# Patient Record
Sex: Female | Born: 1995 | Race: White | Hispanic: No | Marital: Single | Smoking: Never smoker
Health system: Southern US, Community
[De-identification: ages and names within clinical notes are randomized; demographics above are authoritative.]

## PROBLEM LIST (undated history)

## (undated) DIAGNOSIS — R011 Cardiac murmur, unspecified: Secondary | ICD-10-CM

## (undated) DIAGNOSIS — E079 Disorder of thyroid, unspecified: Secondary | ICD-10-CM

## (undated) DIAGNOSIS — H532 Diplopia: Secondary | ICD-10-CM

## (undated) HISTORY — DX: Diplopia: H53.2

## (undated) HISTORY — DX: Disorder of thyroid, unspecified: E07.9

## (undated) HISTORY — DX: Cardiac murmur, unspecified: R01.1

---
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
## 2016-09-15 DIAGNOSIS — Q213 Tetralogy of Fallot: Secondary | ICD-10-CM | POA: Insufficient documentation

## 2016-09-15 DIAGNOSIS — E663 Overweight: Secondary | ICD-10-CM | POA: Diagnosis not present

## 2017-04-16 ENCOUNTER — Ambulatory Visit (INDEPENDENT_AMBULATORY_CARE_PROVIDER_SITE_OTHER): Payer: BLUE CROSS/BLUE SHIELD | Admitting: Internal Medicine

## 2017-04-16 ENCOUNTER — Encounter: Payer: Self-pay | Admitting: Internal Medicine

## 2017-04-16 VITALS — BP 122/80 | HR 70 | Temp 98.1°F | Resp 12 | Ht 59.45 in | Wt 157.2 lb

## 2017-04-16 DIAGNOSIS — Z0000 Encounter for general adult medical examination without abnormal findings: Secondary | ICD-10-CM

## 2017-04-16 DIAGNOSIS — Q213 Tetralogy of Fallot: Secondary | ICD-10-CM

## 2017-04-16 DIAGNOSIS — Z13220 Encounter for screening for lipoid disorders: Secondary | ICD-10-CM

## 2017-04-16 NOTE — Progress Notes (Signed)
Patient ID: Kristine Walton, female   DOB: 1996-02-04, 21 y.o.   MRN: [id number]   Subjective:    Patient ID: Kristine Walton, female    DOB: October 23, 1996, 21 y.o.   MRN: [id number]  HPI  Patient here to establish care.  She has a history of Tetralogy of Fallot.  Followed by cardiology at [facility].  She reports she is doing well.  No chest pain.  No sob.  No acid reflux.  No abdominal pain.  Bowels moving.  Discussed possible sleep apnea.  States she feels rested when awakens.  LMP two days ago.  Overall she feels she is doing well.      Past Medical History:  Diagnosis Date  . Murmur    Past Surgical History:  Procedure Laterality Date  . TETRALOGY OF FALLOT REPAIR  1998   History reviewed. No pertinent family history. Social History   Social History  . Marital status: Single    Spouse name: N/A  . Number of children: N/A  . Years of education: N/A   Social History Main Topics  . Smoking status: Never Smoker  . Smokeless tobacco: Never Used  . Alcohol use No  . Drug use: No  . Sexual activity: Not Asked   Other Topics Concern  . None   Social History Narrative  . None    Outpatient Encounter Prescriptions as of 04/16/2017  Medication Sig  . cetirizine (ZYRTEC) 10 MG tablet Take 1 tablet by mouth daily.   No facility-administered encounter medications on file as of 04/16/2017.     Review of Systems  Constitutional: Negative for appetite change and unexpected weight change.  HENT: Negative for congestion and sinus pressure.   Respiratory: Negative for cough, chest tightness and shortness of breath.   Cardiovascular: Negative for chest pain, palpitations and leg swelling.  Gastrointestinal: Negative for abdominal pain, diarrhea, nausea and vomiting.  Genitourinary: Negative for difficulty urinating and dysuria.  Musculoskeletal: Negative for back pain and joint swelling.  Skin: Negative for color change and rash.  Neurological: Negative for dizziness, light-headedness and  headaches.  Psychiatric/Behavioral: Negative for agitation and dysphoric mood.       Objective:    Physical Exam  Constitutional: She appears well-developed and well-nourished. No distress.  HENT:  Nose: Nose normal.  Mouth/Throat: Oropharynx is clear and moist.  Neck: Neck supple. No thyromegaly present.  Cardiovascular: Normal rate and regular rhythm.   Pulmonary/Chest: Breath sounds normal. No respiratory distress. She has no wheezes.  Abdominal: Soft. Bowel sounds are normal. There is no tenderness.  Musculoskeletal: She exhibits no edema or tenderness.  Lymphadenopathy:    She has no cervical adenopathy.  Skin: No rash noted. No erythema.  Psychiatric: She has a normal mood and affect. Her behavior is normal.    BP 122/80 (BP Location: Right Arm, Cuff Size: Normal)   Pulse 70   Temp 98.1 F (36.7 C) (Oral)   Resp 12   Ht 4' 11.45" (1.51 m)   Wt 157 lb 3.2 oz (71.3 kg)   LMP 04/14/2017   SpO2 98%   BMI 31.27 kg/m  Wt Readings from Last 3 Encounters:  04/16/17 157 lb 3.2 oz (71.3 kg) (85 %, Z= 1.05)*   * Growth percentiles are based on CDC 2-20 Years data.        Assessment & Plan:   Problem List Items Addressed This Visit    Health care maintenance    Will get her set up for a physical.  Tetralogy of Fallot - Primary    Followed by cardiology.  Discussed sleep apnea.  Wants to hold on testing at this time.  Follow.        Relevant Orders   CBC with Differential   Comprehensive metabolic panel   TSH    Other Visit Diagnoses    Screening cholesterol level       Relevant Orders   Lipid panel       Dale DurhamSCOTT, Yidel Teuscher, MD

## 2017-04-16 NOTE — Progress Notes (Signed)
Pre-visit discussion using our clinic review tool. No additional management support is needed unless otherwise documented below in the visit note.  

## 2017-04-25 ENCOUNTER — Encounter: Payer: Self-pay | Admitting: Internal Medicine

## 2017-04-25 DIAGNOSIS — Z0000 Encounter for general adult medical examination without abnormal findings: Secondary | ICD-10-CM | POA: Insufficient documentation

## 2017-04-25 NOTE — Assessment & Plan Note (Signed)
Followed by cardiology.  Discussed sleep apnea.  Wants to hold on testing at this time.  Follow.

## 2017-04-25 NOTE — Assessment & Plan Note (Signed)
Will get her set up for a physical.

## 2017-04-26 ENCOUNTER — Other Ambulatory Visit (INDEPENDENT_AMBULATORY_CARE_PROVIDER_SITE_OTHER): Payer: BLUE CROSS/BLUE SHIELD

## 2017-04-26 DIAGNOSIS — Z13220 Encounter for screening for lipoid disorders: Secondary | ICD-10-CM

## 2017-04-26 DIAGNOSIS — Q213 Tetralogy of Fallot: Secondary | ICD-10-CM | POA: Diagnosis not present

## 2017-04-26 LAB — LIPID PANEL
CHOLESTEROL: 139 mg/dL (ref 0–200)
HDL: 45.2 mg/dL (ref >39.00)
LDL CALC: 57 mg/dL (ref 0–99)
NONHDL: 93.99
Total CHOL/HDL Ratio: 3
Triglycerides: 184 mg/dL — ABNORMAL HIGH (ref 0.0–149.0)
VLDL: 36.8 mg/dL (ref 0.0–40.0)

## 2017-04-26 LAB — COMPREHENSIVE METABOLIC PANEL
ALT: 21 U/L (ref 0–35)
AST: 17 U/L (ref 0–37)
Albumin: 4.5 g/dL (ref 3.5–5.2)
Alkaline Phosphatase: 77 U/L (ref 47–119)
BUN: 14 mg/dL (ref 6–23)
CHLORIDE: 104 meq/L (ref 96–112)
CO2: 24 mEq/L (ref 19–32)
Calcium: 9.6 mg/dL (ref 8.4–10.5)
Creatinine, Ser: 0.69 mg/dL (ref 0.40–1.20)
GFR: 115.4 mL/min (ref >60.00)
GLUCOSE: 91 mg/dL (ref 70–99)
Potassium: 4.3 mEq/L (ref 3.5–5.1)
SODIUM: 135 meq/L (ref 135–145)
Total Bilirubin: 0.2 mg/dL (ref 0.2–1.2)
Total Protein: 7.9 g/dL (ref 6.0–8.3)

## 2017-04-26 LAB — CBC WITH DIFFERENTIAL/PLATELET
BASOS PCT: 0.2 % (ref 0.0–3.0)
Basophils Absolute: 0 10*3/uL (ref 0.0–0.1)
EOS PCT: 1.5 % (ref 0.0–5.0)
Eosinophils Absolute: 0.1 10*3/uL (ref 0.0–0.7)
HEMATOCRIT: 39.5 % (ref 36.0–49.0)
HEMOGLOBIN: 13 g/dL (ref 12.0–16.0)
Lymphocytes Relative: 27.4 % (ref 24.0–48.0)
Lymphs Abs: 2.5 10*3/uL (ref 0.7–4.0)
MCHC: 33 g/dL (ref 31.0–37.0)
MCV: 89 fl (ref 78.0–98.0)
MONOS PCT: 6.8 % (ref 3.0–12.0)
Monocytes Absolute: 0.6 10*3/uL (ref 0.1–1.0)
Neutro Abs: 5.8 10*3/uL (ref 1.4–7.7)
Neutrophils Relative %: 64.1 % (ref 43.0–71.0)
Platelets: 245 10*3/uL (ref 150.0–575.0)
RBC: 4.44 Mil/uL (ref 3.80–5.70)
RDW: 13.8 % (ref 11.4–15.5)
WBC: 9.1 10*3/uL (ref 4.5–13.5)

## 2017-04-26 LAB — TSH: TSH: 5.72 u[IU]/mL — ABNORMAL HIGH (ref 0.40–5.00)

## 2017-04-27 ENCOUNTER — Other Ambulatory Visit: Payer: Self-pay | Admitting: Internal Medicine

## 2017-04-27 DIAGNOSIS — E039 Hypothyroidism, unspecified: Secondary | ICD-10-CM

## 2017-04-27 NOTE — Progress Notes (Signed)
Order placed for f/u tsh.  

## 2017-06-01 ENCOUNTER — Other Ambulatory Visit (INDEPENDENT_AMBULATORY_CARE_PROVIDER_SITE_OTHER): Payer: BLUE CROSS/BLUE SHIELD

## 2017-06-01 DIAGNOSIS — E039 Hypothyroidism, unspecified: Secondary | ICD-10-CM | POA: Diagnosis not present

## 2017-06-01 LAB — TSH: TSH: 6.59 u[IU]/mL — ABNORMAL HIGH (ref 0.40–5.00)

## 2017-06-07 ENCOUNTER — Other Ambulatory Visit: Payer: Self-pay | Admitting: Internal Medicine

## 2017-06-07 DIAGNOSIS — R7989 Other specified abnormal findings of blood chemistry: Secondary | ICD-10-CM

## 2017-06-07 NOTE — Progress Notes (Signed)
Order placed for f/u tsh.  

## 2017-07-27 ENCOUNTER — Other Ambulatory Visit: Payer: BLUE CROSS/BLUE SHIELD

## 2017-08-06 ENCOUNTER — Other Ambulatory Visit (INDEPENDENT_AMBULATORY_CARE_PROVIDER_SITE_OTHER): Payer: BLUE CROSS/BLUE SHIELD

## 2017-08-06 DIAGNOSIS — R946 Abnormal results of thyroid function studies: Secondary | ICD-10-CM

## 2017-08-06 DIAGNOSIS — R7989 Other specified abnormal findings of blood chemistry: Secondary | ICD-10-CM

## 2017-08-06 LAB — TSH: TSH: 7.77 u[IU]/mL — AB (ref 0.35–5.50)

## 2017-08-09 ENCOUNTER — Other Ambulatory Visit: Payer: Self-pay

## 2017-08-09 ENCOUNTER — Other Ambulatory Visit: Payer: Self-pay | Admitting: Internal Medicine

## 2017-08-09 DIAGNOSIS — E039 Hypothyroidism, unspecified: Secondary | ICD-10-CM

## 2017-08-09 MED ORDER — LEVOTHYROXINE SODIUM 50 MCG PO TABS: 50.0000 ug | ORAL_TABLET | Freq: Every day | ORAL | 3 refills | Status: DC

## 2017-08-09 NOTE — Progress Notes (Signed)
Order placed for f/u tsh.  

## 2017-09-14 DIAGNOSIS — I517 Cardiomegaly: Secondary | ICD-10-CM | POA: Diagnosis not present

## 2017-09-14 DIAGNOSIS — G4733 Obstructive sleep apnea (adult) (pediatric): Secondary | ICD-10-CM | POA: Diagnosis not present

## 2017-09-14 DIAGNOSIS — E663 Overweight: Secondary | ICD-10-CM | POA: Diagnosis not present

## 2017-09-14 DIAGNOSIS — I071 Rheumatic tricuspid insufficiency: Secondary | ICD-10-CM | POA: Diagnosis not present

## 2017-09-14 DIAGNOSIS — Q213 Tetralogy of Fallot: Secondary | ICD-10-CM | POA: Diagnosis not present

## 2017-09-14 DIAGNOSIS — J984 Other disorders of lung: Secondary | ICD-10-CM | POA: Diagnosis not present

## 2017-09-14 DIAGNOSIS — Z23 Encounter for immunization: Secondary | ICD-10-CM | POA: Diagnosis not present

## 2017-09-21 ENCOUNTER — Other Ambulatory Visit: Payer: BLUE CROSS/BLUE SHIELD

## 2017-09-21 ENCOUNTER — Other Ambulatory Visit
Admission: RE | Admit: 2017-09-21 | Discharge: 2017-09-21 | Disposition: A | Discharge status: Home / Self Care | Payer: BLUE CROSS/BLUE SHIELD | Source: Ambulatory Visit | Attending: Internal Medicine | Admitting: Internal Medicine

## 2017-09-21 DIAGNOSIS — E039 Hypothyroidism, unspecified: Secondary | ICD-10-CM | POA: Insufficient documentation

## 2017-09-21 LAB — TSH: TSH: 2.996 u[IU]/mL (ref 0.350–4.500)

## 2017-09-21 NOTE — Addendum Note (Signed)
Addended by: Penne Lash on: 09/21/2017 03:20 PM   Modules accepted: Orders

## 2017-09-21 NOTE — Addendum Note (Signed)
Addended by: Penne Lash on: 09/21/2017 10:15 AM   Modules accepted: Orders

## 2017-09-22 ENCOUNTER — Other Ambulatory Visit: Payer: Self-pay | Admitting: Internal Medicine

## 2017-09-22 DIAGNOSIS — E039 Hypothyroidism, unspecified: Secondary | ICD-10-CM

## 2017-09-22 NOTE — Progress Notes (Signed)
Order placed for f/u tsh.  

## 2017-10-11 ENCOUNTER — Telehealth: Payer: Self-pay | Admitting: Internal Medicine

## 2017-10-11 DIAGNOSIS — R899 Unspecified abnormal finding in specimens from other organs, systems and tissues: Secondary | ICD-10-CM

## 2017-10-11 NOTE — Telephone Encounter (Signed)
Can you put order in and I will call and let mom know.

## 2017-10-11 NOTE — Telephone Encounter (Signed)
TSH order is already in.  Does she need another lab?

## 2017-10-11 NOTE — Telephone Encounter (Signed)
Pt mom called about pt needing a lab appt for 3 month. Order is needed please and thank you! Pt mom would like for pt to go to the hospital to get labs done. Please advise?  Call mom @ [phone].

## 2017-10-13 NOTE — Telephone Encounter (Signed)
Order [facility] for [facility] and message sent via my chart to patient to let her know.

## 2017-10-20 DIAGNOSIS — Q213 Tetralogy of Fallot: Secondary | ICD-10-CM | POA: Diagnosis not present

## 2017-11-29 ENCOUNTER — Other Ambulatory Visit: Payer: Self-pay | Admitting: Internal Medicine

## 2017-12-31 ENCOUNTER — Other Ambulatory Visit
Admission: RE | Admit: 2017-12-31 | Discharge: 2017-12-31 | Disposition: A | Discharge status: Home / Self Care | Payer: BLUE CROSS/BLUE SHIELD | Source: Ambulatory Visit | Attending: Internal Medicine | Admitting: Internal Medicine

## 2017-12-31 DIAGNOSIS — R899 Unspecified abnormal finding in specimens from other organs, systems and tissues: Secondary | ICD-10-CM

## 2017-12-31 LAB — TSH: TSH: 2.507 u[IU]/mL (ref 0.350–4.500)

## 2018-01-01 ENCOUNTER — Encounter: Payer: Self-pay | Admitting: Internal Medicine

## 2018-02-23 ENCOUNTER — Encounter: Payer: Self-pay | Admitting: Internal Medicine

## 2018-02-24 NOTE — Telephone Encounter (Signed)
I have only seen her on one visit.  If she is having persistent issues, she needs to be evaluated and then can best determine long term treatment.  Is she pregnant?  If so, needs to contact her OB for evaluation and treatment.

## 2018-02-24 NOTE — Telephone Encounter (Signed)
Called and spoke with patients mom. She is not pregnant and this is persistent but she does not get them very often. She uses abreva when she does get them. Patients mom stated that she agreed with evaluation since you have only seen her once and also stated that she has her yearly check up on 04/19/18 and they would discuss treatment with you then. They do not feel that it is urgent. If something changes and they feel that she needs to come in at earlier date, they will call us back.

## 2018-03-22 DIAGNOSIS — H5213 Myopia, bilateral: Secondary | ICD-10-CM | POA: Diagnosis not present

## 2018-03-23 ENCOUNTER — Other Ambulatory Visit: Payer: Self-pay | Admitting: Internal Medicine

## 2018-04-01 ENCOUNTER — Encounter: Payer: Self-pay | Admitting: Neurology

## 2018-04-01 ENCOUNTER — Ambulatory Visit: Payer: BLUE CROSS/BLUE SHIELD | Admitting: Neurology

## 2018-04-01 DIAGNOSIS — H532 Diplopia: Secondary | ICD-10-CM

## 2018-04-01 DIAGNOSIS — R799 Abnormal finding of blood chemistry, unspecified: Secondary | ICD-10-CM | POA: Diagnosis not present

## 2018-04-01 DIAGNOSIS — Z79899 Other long term (current) drug therapy: Secondary | ICD-10-CM | POA: Diagnosis not present

## 2018-04-01 NOTE — Progress Notes (Signed)
Reason for visit: Double vision  Referring physician: Dr. [name] is a 22 y.o. female  History of present illness:  Kristine Walton is a 22 year old right-handed white female with a history of tetralogy of Fallot, status post repair.  The patient comes into the office today for evaluation of double vision problems that have been present for 3 years.  The patient has not had any progression of the double vision but the double vision has not improved either.  The double vision is persistent, unassociated with ptosis.  The double vision is horizontal in nature, the patient is unable to determine whether it is worse when looking to the left or looking to the right.  She has had prisms placed in the glasses but she continues to have some double vision problems.  The patient reports no other symptoms of numbness or weakness of the face, arms, or legs.  She denies issues with speech or swallowing, she has not had any difficulty with balance or difficulty controlling the bowels or the bladder.  She has a history of migraine headaches that are occurring on average twice a month.  The patient reports a history of thyroid disease, she is on Synthroid currently.  She is sent to this office for an evaluation.  Past Medical History:  Diagnosis Date  . Diplopia   . Murmur   . Premature birth   . Thyroid disease     Past Surgical History:  Procedure Laterality Date  . TETRALOGY OF FALLOT REPAIR  1998    Family History  Problem Relation Age of Onset  . Healthy Mother   . Hypertension Father     Social history:  reports that she has never smoked. She has never used smokeless tobacco. She reports that she does not drink alcohol or use drugs.  Medications:  Prior to Admission medications   Medication Sig Start Date End Date Taking? Authorizing Provider  cetirizine (ZYRTEC) 10 MG tablet Take 1 tablet by mouth as needed.    Yes [provider]  levothyroxine (SYNTHROID,  LEVOTHROID) 50 MCG tablet TAKE 1 TABLET BY MOUTH EVERY DAY 03/24/18  Yes Dale Smithfield, MD     No Known Allergies  ROS:  Out of a complete 14 system review of symptoms, the patient complains only of the following symptoms, and all other reviewed systems are negative.  Heart murmur Double vision Snoring Headache  Blood pressure 123/78, pulse 81, height 4\' 10"  (1.473 m), weight 149 lb 12 oz (67.9 kg).  Physical Exam  General: The patient is alert and cooperative at the time of the examination.  The patient is moderately obese.  Eyes: Pupils are equal, round, and reactive to light. Discs are flat bilaterally.  Neck: The neck is supple, no carotid bruits are noted.  Respiratory: The respiratory examination is clear.  Cardiovascular: The cardiovascular examination reveals a regular rate and rhythm, no obvious murmurs or rubs are noted.  Skin: Extremities are without significant edema.  Neurologic Exam  Mental status: The patient is alert and oriented x 3 at the time of the examination. The patient has apparent normal recent and remote memory, with an apparently normal attention span and concentration ability.  Cranial nerves: Facial symmetry is present. There is good sensation of the face to pinprick and soft touch bilaterally. The strength of the facial muscles and the muscles to head turning and shoulder shrug are normal bilaterally. Speech is well enunciated, no aphasia or dysarthria is noted. Extraocular  movements are full. Visual fields are full. The tongue is midline, and the patient has symmetric elevation of the soft palate. No obvious hearing deficits are noted.  Cover test is positive with horizontal eye movements.  Motor: The motor testing reveals 5 over 5 strength of all 4 extremities. Good symmetric motor tone is noted throughout.  Sensory: Sensory testing is intact to pinprick, soft touch, vibration sensation, and position sense on all 4 extremities. No evidence of  extinction is noted.  Coordination: Cerebellar testing reveals good finger-nose-finger and heel-to-shin bilaterally.  Gait and station: Gait is normal. Tandem gait is normal. Romberg is negative. No drift is seen.  Reflexes: Deep tendon reflexes are symmetric and normal bilaterally. Toes are downgoing bilaterally.   Assessment/Plan:  1.  Horizontal double vision  2.  History of tetralogy of Fallot repair  The patient has clear separation of the eyes in a horizontal plane.  She may have strabismus, but we will look for other etiologies of her double vision.  She will be sent for MRI of the brain, blood work will be done today.  Kristine Walton. Keith Willis MD 04/01/2018 10:13 AM  [facility] [address] [place], [place] [address]  Phone [phone] Fax [phone]

## 2018-04-04 ENCOUNTER — Telehealth: Payer: Self-pay | Admitting: Neurology

## 2018-04-04 NOTE — Telephone Encounter (Signed)
Patient is schedule to have her MRI done at [facility] on Friday arrival time is 4:30 pm. Patient step mom Sue Lushndrea which she is on the dpr is aware of time & day. I did leave her their number of [phone] if for some reason this doesn't work.  [employer] Auth: [id number] (exp. 04/04/18 to 05/03/18).

## 2018-04-05 ENCOUNTER — Telehealth: Payer: Self-pay | Admitting: Neurology

## 2018-04-05 LAB — COMPREHENSIVE METABOLIC PANEL
ALK PHOS: 97 IU/L (ref 39–117)
ALT: 15 IU/L (ref 0–32)
AST: 16 IU/L (ref 0–40)
Albumin/Globulin Ratio: 1.6 (ref 1.2–2.2)
Albumin: 4.8 g/dL (ref 3.5–5.5)
BILIRUBIN TOTAL: 0.4 mg/dL (ref 0.0–1.2)
BUN/Creatinine Ratio: 20 (ref 9–23)
BUN: 12 mg/dL (ref 6–20)
CHLORIDE: 102 mmol/L (ref 96–106)
CO2: 20 mmol/L (ref 20–29)
Calcium: 10.5 mg/dL — ABNORMAL HIGH (ref 8.7–10.2)
Creatinine, Ser: 0.59 mg/dL (ref 0.57–1.00)
GFR calc Af Amer: 153 mL/min/{1.73_m2} (ref >59)
GFR calc non Af Amer: 132 mL/min/{1.73_m2} (ref >59)
GLUCOSE: 82 mg/dL (ref 65–99)
Globulin, Total: 3 g/dL (ref 1.5–4.5)
POTASSIUM: 3.8 mmol/L (ref 3.5–5.2)
Sodium: 140 mmol/L (ref 134–144)
Total Protein: 7.8 g/dL (ref 6.0–8.5)

## 2018-04-05 LAB — ANGIOTENSIN CONVERTING ENZYME: ANGIO CONVERT ENZYME: 25 U/L (ref 14–82)

## 2018-04-05 LAB — ANA W/REFLEX: ANA: NEGATIVE

## 2018-04-05 LAB — VITAMIN B12: VITAMIN B 12: 453 pg/mL (ref 232–1245)

## 2018-04-05 LAB — THYROID PEROXIDASE ANTIBODY: THYROID PEROXIDASE ANTIBODY: 102 [IU]/mL — AB (ref 0–34)

## 2018-04-05 LAB — SEDIMENTATION RATE: SED RATE: 32 mm/h (ref 0–32)

## 2018-04-05 LAB — ACETYLCHOLINE RECEPTOR, BINDING: ACHR BINDING AB, SERUM: 0.05 nmol/L (ref 0.00–0.24)

## 2018-04-05 LAB — THYROGLOBULIN ANTIBODY: Thyroglobulin Antibody: 4.7 IU/mL — ABNORMAL HIGH (ref 0.0–0.9)

## 2018-04-05 LAB — B. BURGDORFI ANTIBODIES

## 2018-04-05 NOTE — Telephone Encounter (Signed)
I called patient, talk with the mother.  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  This could put her at risk for thyroid eye disease which could be a source of her double vision.

## 2018-04-08 ENCOUNTER — Ambulatory Visit
Admission: RE | Admit: 2018-04-08 | Discharge: 2018-04-08 | Disposition: A | Discharge status: Home / Self Care | Payer: BLUE CROSS/BLUE SHIELD | Source: Ambulatory Visit | Attending: Neurology | Admitting: Neurology

## 2018-04-08 DIAGNOSIS — H532 Diplopia: Secondary | ICD-10-CM | POA: Diagnosis not present

## 2018-04-08 MED ORDER — GADOBENATE DIMEGLUMINE 529 MG/ML IV SOLN
10.0000 mL | Freq: Once | INTRAVENOUS | Status: AC | PRN
Start: 2018-04-08 — End: 2018-04-08
  Administered 2018-04-08: 10 mL via INTRAVENOUS

## 2018-04-10 ENCOUNTER — Telehealth: Payer: Self-pay | Admitting: Neurology

## 2018-04-10 DIAGNOSIS — H532 Diplopia: Secondary | ICD-10-CM

## 2018-04-10 NOTE — Telephone Encounter (Signed)
  Tried to call both numbers, unable to leave a message, MRI of the brain is normal.  Given blood work, the double vision may be related to an autoimmune thyroid disease, if prisms cannot be used to fully correct double vision, strabismus surgery may be required.  MRI brain 04/09/18:  IMPRESSION: Normal brain MRI.

## 2018-04-11 ENCOUNTER — Telehealth: Payer: Self-pay | Admitting: Neurology

## 2018-04-11 NOTE — Telephone Encounter (Signed)
Patient's apt is scheduled with Dr. Theone Murdoch August 26 th at 1:40 . Patient needs to arrive 15 minutes early . This was Dr. Ermalene Searing first apt. Patient is also on CX list for sooner apt. Telephone [phone] - fax [phone]

## 2018-04-11 NOTE — Telephone Encounter (Signed)
Patient's father called back. He wants to discuss patient's eye problems which could be coming from elevated thyroid. Please call and discuss.

## 2018-04-11 NOTE — Addendum Note (Signed)
Addended by: Hillis Range on: 04/11/2018 08:41 AM   Modules accepted: Orders

## 2018-04-11 NOTE — Telephone Encounter (Signed)
Called and spoke w/ pt father. Relayed lab results per CW,MD  Note from 04/05/18 re: thyroid :"she has positive thyroid peroxidase and thyroglobulin antibodies that suggest that she has an immune based thyroid disease such as Hashimoto's or Graves' disease.  This could put her at risk for thyroid eye disease which could be a source of her double vision."  He states she already saw PCP who started her on thyroid medication. Wondering what they need to do to further address this.  Advised I will send message to Dr. Anne Hahn to address this once back in the office. He will be back tomorrow. He verbalized understanding and appreciation for call.

## 2018-04-11 NOTE — Telephone Encounter (Signed)
I called the father.  I discussed the issue with the elevated thyroglobulin and thyroid peroxidase antibodies.  I suspect that the patient has an autoimmune based thyroid disease that may be leading to her double vision.  If prisms do not correct the double vision, then strabismus surgery may be required.

## 2018-04-11 NOTE — Telephone Encounter (Signed)
Called and spoke w/ father. Relayed results per CW,MD note. Father verblized understanding.   They would like [place] to send referral to ophthalmology for them. They will make optometrist aware. Advised we will refer to Dr. Gentry Roch at [facility] for them. They should hear within 1-2 weeks to schedule. If not, advised them to contact me.

## 2018-04-19 ENCOUNTER — Encounter: Payer: BLUE CROSS/BLUE SHIELD | Admitting: Internal Medicine

## 2018-04-25 ENCOUNTER — Ambulatory Visit (INDEPENDENT_AMBULATORY_CARE_PROVIDER_SITE_OTHER): Payer: BLUE CROSS/BLUE SHIELD | Admitting: Internal Medicine

## 2018-04-25 ENCOUNTER — Telehealth: Payer: Self-pay

## 2018-04-25 ENCOUNTER — Encounter: Payer: Self-pay | Admitting: Internal Medicine

## 2018-04-25 ENCOUNTER — Other Ambulatory Visit (HOSPITAL_COMMUNITY)
Admission: RE | Admit: 2018-04-25 | Discharge: 2018-04-25 | Disposition: A | Discharge status: Home / Self Care | Payer: BLUE CROSS/BLUE SHIELD | Source: Ambulatory Visit | Attending: Internal Medicine | Admitting: Internal Medicine

## 2018-04-25 DIAGNOSIS — E039 Hypothyroidism, unspecified: Secondary | ICD-10-CM

## 2018-04-25 DIAGNOSIS — B001 Herpesviral vesicular dermatitis: Secondary | ICD-10-CM

## 2018-04-25 DIAGNOSIS — H532 Diplopia: Secondary | ICD-10-CM

## 2018-04-25 DIAGNOSIS — Z124 Encounter for screening for malignant neoplasm of cervix: Secondary | ICD-10-CM

## 2018-04-25 DIAGNOSIS — Z0000 Encounter for general adult medical examination without abnormal findings: Secondary | ICD-10-CM

## 2018-04-25 DIAGNOSIS — Q213 Tetralogy of Fallot: Secondary | ICD-10-CM | POA: Diagnosis not present

## 2018-04-25 MED ORDER — ACYCLOVIR 400 MG PO TABS: ORAL_TABLET | ORAL | 0 refills | Status: AC

## 2018-04-25 NOTE — Progress Notes (Signed)
Patient ID: Kristine Walton, female   DOB: [birth year], 22 y.o.   MRN: [id number]   Subjective:    Patient ID: Kristine Walton, female    DOB: 03-03-1996, 21 y.o.   MRN: [id number]  HPI  Patient here for her physical exam.  She is accompanied by her mother and father.  History obtained from all of them.  She saw Dr Anne Hahn recently for double vision.  Present for years.  Note reviewed.  Has seen ophthalmology.  Had prisms placed in glasses.  Has helped some.  MRI unrevealing - normal MRI.  Was found to have thyroid peroxidase and thyroglobulin antibodies.  He felt this could put her at risk for thyroid eye disease and be a source of her double vision.  Felt if prisms did not improve the double vision, then would need strabismus surgery.  Again, she reports vision has improved some.  No headache.  No sinus symptoms.  Breathing stable.  Sees cardiology regularly and plans to f/u with cardiology.  No abdominal pain.  Bowels moving.  Periods regular.     Past Medical History:  Diagnosis Date  . Diplopia   . Murmur   . Premature birth   . Thyroid disease    Past Surgical History:  Procedure Laterality Date  . TETRALOGY OF FALLOT REPAIR  1998   Family History  Problem Relation Age of Onset  . Healthy Mother   . Hypertension Father    Social History   Socioeconomic History  . Marital status: Single    Spouse name: Not on file  . Number of children: 0  . Years of education: 57  . Highest education level: High school graduate  Occupational History  . Occupation: Unemployed  Social Needs  . Financial resource strain: Not on file  . Food insecurity:    Worry: Not on file    Inability: Not on file  . Transportation needs:    Medical: Not on file    Non-medical: Not on file  Tobacco Use  . Smoking status: Never Smoker  . Smokeless tobacco: Never Used  Substance and Sexual Activity  . Alcohol use: No  . Drug use: No  . Sexual activity: Not on file  Lifestyle  . Physical activity:   Days per week: Not on file    Minutes per session: Not on file  . Stress: Not on file  Relationships  . Social connections:    Talks on phone: Not on file    Gets together: Not on file    Attends religious service: Not on file    Active member of club or organization: Not on file    Attends meetings of clubs or organizations: Not on file    Relationship status: Not on file  Other Topics Concern  . Not on file  Social History Narrative   Lives at home with her father and stepmother.   Right-handed.   Occasional caffeine use.    Outpatient Encounter Medications as of 04/25/2018  Medication Sig  . acyclovir (ZOVIRAX) 400 MG tablet Take one tablet tid x 5 days - with flare.  . cetirizine (ZYRTEC) 10 MG tablet Take 1 tablet by mouth as needed.   Marland Kitchen levothyroxine (SYNTHROID, LEVOTHROID) 50 MCG tablet TAKE 1 TABLET BY MOUTH EVERY DAY   No facility-administered encounter medications on file as of 04/25/2018.     Review of Systems  Constitutional: Negative for appetite change and unexpected weight change.  HENT: Negative for congestion and sinus pressure.  Eyes: Positive for visual disturbance. Negative for pain.       Double vision as outlined.  Improved.    Respiratory: Negative for cough, chest tightness and shortness of breath.   Cardiovascular: Negative for chest pain, palpitations and leg swelling.  Gastrointestinal: Negative for abdominal pain, constipation and diarrhea.  Genitourinary: Negative for difficulty urinating and dysuria.  Musculoskeletal: Negative for back pain and joint swelling.  Skin: Negative for color change and rash.  Neurological: Negative for dizziness and headaches.  Hematological: Negative for adenopathy. Does not bruise/bleed easily.  Psychiatric/Behavioral: Negative for decreased concentration and dysphoric mood.       Objective:    Physical Exam  Constitutional: She is oriented to person, place, and time. She appears well-developed and  well-nourished. No distress.  HENT:  Nose: Nose normal.  Mouth/Throat: Oropharynx is clear and moist.  Eyes: Right eye exhibits no discharge. Left eye exhibits no discharge. No scleral icterus.  Neck: Neck supple. No thyromegaly present.  Cardiovascular: Normal rate and regular rhythm.  Pulmonary/Chest: Breath sounds normal. No accessory muscle usage. No tachypnea. No respiratory distress. She has no decreased breath sounds. She has no wheezes. She has no rhonchi. Right breast exhibits no inverted nipple, no mass, no nipple discharge and no tenderness (no axillary adenopathy). Left breast exhibits no inverted nipple, no mass, no nipple discharge and no tenderness (no axilarry adenopathy).  Abdominal: Soft. Bowel sounds are normal. There is no tenderness.  Genitourinary:  Genitourinary Comments: Normal external genitalia.  Vaginal vault without lesions.  Cervix identified.  Pap smear performed.  Could not appreciate any adnexal masses or tenderness.    Musculoskeletal: She exhibits no edema or tenderness.  Lymphadenopathy:    She has no cervical adenopathy.  Neurological: She is alert and oriented to person, place, and time.  Skin: No rash noted. No erythema.  Psychiatric: She has a normal mood and affect. Her behavior is normal.    BP 130/78 (BP Location: Right Arm, Patient Position: Sitting, Cuff Size: Normal)   Pulse 85   Temp 98.6 F (37 C) (Oral)   Resp 16   Ht  (1.473 m)   Wt 154 lb 6.4 oz (70 kg)   SpO2 98%   BMI 32.27 kg/m  Wt Readings from Last 3 Encounters:  04/25/18 154 lb 6.4 oz (70 kg)  04/01/18 149 lb 12 oz (67.9 kg)  04/16/17 157 lb 3.2 oz (71.3 kg) (85 %, Z= 1.05)*   * Growth percentiles are based on CDC (Girls, 2-20 Years) data.     Lab Results  Component Value Date   WBC 9.1 04/26/2017   HGB 13.0 04/26/2017   HCT 39.5 04/26/2017   PLT 245.0 04/26/2017   GLUCOSE 82 04/01/2018   CHOL 139 04/26/2017   TRIG 184.0 (H) 04/26/2017   HDL 45.20 04/26/2017     LDLCALC 57 04/26/2017   ALT 15 04/01/2018   AST 16 04/01/2018   NA 140 04/01/2018   K 3.8 04/01/2018   CL 102 04/01/2018   CREATININE 0.59 04/01/2018   BUN 12 04/01/2018   CO2 20 04/01/2018   TSH 2.507 12/31/2017    Mr Brain W Wo Contrast  Result Date: 04/08/2018 CLINICAL DATA:  Diplopia.  Remote history of concussion. EXAM: MRI HEAD WITHOUT AND WITH CONTRAST TECHNIQUE: Multiplanar, multiecho pulse sequences of the brain and surrounding structures were obtained without and with intravenous contrast. CONTRAST:  10mL MULTIHANCE GADOBENATE DIMEGLUMINE 529 MG/ML IV SOLN COMPARISON:  Head CT 11/13/2005 FINDINGS: BRAIN: The  midline structures are normal. There is no acute infarct or acute hemorrhage. No mass lesion, hydrocephalus, dural abnormality or extra-axial collection. The brain parenchymal signal is normal for the patient's age. No age-advanced or lobar predominant atrophy. No chronic microhemorrhage or superficial siderosis. No contrast-enhancing abnormality. VASCULAR: Major intracranial arterial and venous sinus flow voids are preserved. SKULL AND UPPER CERVICAL SPINE: The visualized skull base, calvarium, upper cervical spine and extracranial soft tissues are normal. SINUSES/ORBITS: No fluid levels or advanced mucosal thickening. No mastoid or middle ear effusion. Normal orbits. IMPRESSION: Normal brain MRI. Electronically Signed   By: Deatra Robinson M.D.   On: 04/08/2018 17:47       Assessment & Plan:   Problem List Items Addressed This Visit    Diplopia    Persistent but has improved with prisms.  Evaluated by ophthalmology and now seeing neurology.  MRI - negative.  Planning to see specialist - to discuss surgical options.        Health care maintenance    Physical today 04/25/18.  PAP 04/25/18.        Hypothyroidism    On synthroid.  Follow tsh.  Last checked wnl.  Was found to have positive thyroid peroxidase and thyroglobulin antibodies.        Relevant Orders   TSH    Tetralogy of Fallot    Followed by cardiology.  Plans to continue f/u with her cardiologist - for f/u holter and MRI.         Other Visit Diagnoses    Screening for cervical cancer       Relevant Orders   Cytology - PAP (Completed)   Fever blister       Recurring.  Request rx for acyclovir to have if needed.  Rx given.     Relevant Medications   acyclovir (ZOVIRAX) 400 MG tablet   Routine general medical examination at a health care facility           Dale Dunlap, MD

## 2018-04-25 NOTE — Telephone Encounter (Signed)
Pt needs f/u scheduled in 4 months.

## 2018-04-26 NOTE — Telephone Encounter (Signed)
Pt scheduled  

## 2018-04-28 ENCOUNTER — Encounter: Payer: Self-pay | Admitting: Internal Medicine

## 2018-04-28 LAB — CYTOLOGY - PAP
Diagnosis: NEGATIVE
HPV (WINDOPATH): NOT DETECTED

## 2018-05-02 ENCOUNTER — Encounter: Payer: Self-pay | Admitting: Internal Medicine

## 2018-05-02 DIAGNOSIS — E039 Hypothyroidism, unspecified: Secondary | ICD-10-CM | POA: Insufficient documentation

## 2018-05-02 NOTE — Assessment & Plan Note (Signed)
Followed by cardiology.  Plans to continue f/u with her cardiologist - for f/u holter and MRI.

## 2018-05-02 NOTE — Assessment & Plan Note (Signed)
On synthroid.  Follow tsh.  Last checked wnl.  Was found to have positive thyroid peroxidase and thyroglobulin antibodies.

## 2018-05-02 NOTE — Assessment & Plan Note (Signed)
Persistent but has improved with prisms.  Evaluated by ophthalmology and now seeing neurology.  MRI - negative.  Planning to see specialist - to discuss surgical options.

## 2018-05-02 NOTE — Assessment & Plan Note (Signed)
Physical today 04/25/18.  PAP 04/25/18.

## 2018-07-07 ENCOUNTER — Telehealth: Payer: Self-pay | Admitting: Internal Medicine

## 2018-07-07 NOTE — Telephone Encounter (Signed)
Copied from CRM [id number]. Topic: Quick Communication - Rx Refill/Question >> Jul 07, 2018  4:09 PM Darletta MollLander, Lumin L wrote: Medication: levothyroxine (SYNTHROID, LEVOTHROID) 50 MCG tablet   Has the patient contacted their pharmacy? Yes.   (Agent: If no, request that the patient contact the pharmacy for the refill.) (Agent: If yes, when and what did the pharmacy advise?)  Preferred Pharmacy (with phone number or street name): [facility] [id number] [facility] [place], [place] - [address] [place] [address] Phone: [phone] Fax: [phone]   Agent: Please be advised that RX refills may take up to 3 business days. We ask that you follow-up with your pharmacy.

## 2018-07-08 ENCOUNTER — Other Ambulatory Visit: Payer: Self-pay | Admitting: *Deleted

## 2018-07-08 ENCOUNTER — Telehealth: Payer: Self-pay | Admitting: Internal Medicine

## 2018-07-08 MED ORDER — LEVOTHYROXINE SODIUM 50 MCG PO TABS: 50.0000 ug | ORAL_TABLET | Freq: Every day | ORAL | 3 refills | Status: DC

## 2018-07-08 NOTE — Telephone Encounter (Signed)
Called pt to schedule, unable to leave voicemail.

## 2018-07-08 NOTE — Telephone Encounter (Signed)
Patients mother called back and states that her daughter needs to be scheduled at the hospital to have labs drawn.

## 2018-07-08 NOTE — Telephone Encounter (Signed)
Medication was refilled today for 3 months. Patients mom is aware

## 2018-07-08 NOTE — Telephone Encounter (Signed)
Pt also mentioned that she may need to have labs for medication. Thank u!

## 2018-07-08 NOTE — Telephone Encounter (Signed)
Yes, I would like to recheck her tsh.  Order is already in.  Non fasting lab - please schedule

## 2018-07-08 NOTE — Telephone Encounter (Signed)
Patient had TSH drawn in 02/2018. Do you want her to have another before 1 yr?

## 2018-07-08 NOTE — Telephone Encounter (Signed)
Pt does not have anymore refills until aug 1st and she took her last one today. Pt wants to know is it ok if she waits until then? Or what can she do?    levothyroxine (SYNTHROID, LEVOTHROID) 50 MCG tablet   Pharmacy is [id number] [place] - [address]   Please advise? Call pt @ [phone]. Thank you!

## 2018-07-08 NOTE — Telephone Encounter (Signed)
Patient and patient's mother on the line call and states that they received a call. Informed them it was regarding scheduling a TSH recheck. Patient's mother then said that the lab has to be drawn at the hospital since the patient has tiny veins. Unable to schedule. Please advise.

## 2018-07-11 ENCOUNTER — Other Ambulatory Visit: Payer: Self-pay | Admitting: Internal Medicine

## 2018-07-11 DIAGNOSIS — E039 Hypothyroidism, unspecified: Secondary | ICD-10-CM

## 2018-07-11 NOTE — Telephone Encounter (Signed)
Returning call [phone], please call back between 12-2

## 2018-07-11 NOTE — Telephone Encounter (Signed)
Order placed for tsh to be drawn at hospital.

## 2018-07-11 NOTE — Progress Notes (Signed)
Order placed for tsh 

## 2018-07-11 NOTE — Telephone Encounter (Signed)
Patient would like to have labs done at the hospital

## 2018-07-11 NOTE — Telephone Encounter (Signed)
Called patients mom. Unable to leave message due to voicemail not being set up.

## 2018-07-11 NOTE — Telephone Encounter (Signed)
Patient returned call

## 2018-07-12 NOTE — Telephone Encounter (Signed)
Unable to reach by phone. Mychart sent.

## 2018-07-15 NOTE — Telephone Encounter (Signed)
Relation to pt: self Call back number: [phone] (M)   Reason for call:  Patient returned call and informed of my chart message and patient voice understand, in addition confirmed contact / demographic information and all is correct.

## 2018-09-14 ENCOUNTER — Encounter: Payer: Self-pay | Admitting: Internal Medicine

## 2018-09-14 ENCOUNTER — Ambulatory Visit: Payer: BLUE CROSS/BLUE SHIELD | Admitting: Internal Medicine

## 2018-09-14 ENCOUNTER — Other Ambulatory Visit
Admission: RE | Admit: 2018-09-14 | Discharge: 2018-09-14 | Disposition: A | Discharge status: Home / Self Care | Payer: BLUE CROSS/BLUE SHIELD | Source: Ambulatory Visit | Attending: Internal Medicine | Admitting: Internal Medicine

## 2018-09-14 VITALS — BP 118/78 | HR 76 | Temp 98.4°F | Resp 18 | Wt 162.8 lb

## 2018-09-14 DIAGNOSIS — Z13220 Encounter for screening for lipoid disorders: Secondary | ICD-10-CM | POA: Diagnosis not present

## 2018-09-14 DIAGNOSIS — R42 Dizziness and giddiness: Secondary | ICD-10-CM

## 2018-09-14 DIAGNOSIS — H532 Diplopia: Secondary | ICD-10-CM | POA: Diagnosis not present

## 2018-09-14 DIAGNOSIS — Q213 Tetralogy of Fallot: Secondary | ICD-10-CM | POA: Diagnosis not present

## 2018-09-14 DIAGNOSIS — E039 Hypothyroidism, unspecified: Secondary | ICD-10-CM | POA: Diagnosis not present

## 2018-09-14 LAB — CBC WITH DIFFERENTIAL/PLATELET
BASOS ABS: 0 10*3/uL (ref 0–0.1)
BASOS PCT: 0 %
EOS ABS: 0 10*3/uL (ref 0–0.7)
EOS PCT: 1 %
HCT: 37.7 % (ref 35.0–47.0)
HEMOGLOBIN: 12.8 g/dL (ref 12.0–16.0)
LYMPHS ABS: 2.1 10*3/uL (ref 1.0–3.6)
Lymphocytes Relative: 25 %
MCH: 30.6 pg (ref 26.0–34.0)
MCHC: 34 g/dL (ref 32.0–36.0)
MCV: 90 fL (ref 80.0–100.0)
Monocytes Absolute: 0.5 10*3/uL (ref 0.2–0.9)
Monocytes Relative: 7 %
NEUTROS PCT: 67 %
Neutro Abs: 5.6 10*3/uL (ref 1.4–6.5)
Platelets: 211 10*3/uL (ref 150–440)
RBC: 4.19 MIL/uL (ref 3.80–5.20)
RDW: 13.2 % (ref 11.5–14.5)
WBC: 8.3 10*3/uL (ref 3.6–11.0)

## 2018-09-14 LAB — COMPREHENSIVE METABOLIC PANEL
ALBUMIN: 4.3 g/dL (ref 3.5–5.0)
ALK PHOS: 68 U/L (ref 38–126)
ALT: 23 U/L (ref 0–44)
AST: 21 U/L (ref 15–41)
Anion gap: 10 (ref 5–15)
BUN: 14 mg/dL (ref 6–20)
CALCIUM: 9.8 mg/dL (ref 8.9–10.3)
CHLORIDE: 109 mmol/L (ref 98–111)
CO2: 23 mmol/L (ref 22–32)
CREATININE: 0.67 mg/dL (ref 0.44–1.00)
GFR calc non Af Amer: >60 mL/min (ref >60)
Glucose, Bld: 103 mg/dL — ABNORMAL HIGH (ref 70–99)
Potassium: 4.3 mmol/L (ref 3.5–5.1)
SODIUM: 142 mmol/L (ref 135–145)
Total Bilirubin: 0.6 mg/dL (ref 0.3–1.2)
Total Protein: 8.3 g/dL — ABNORMAL HIGH (ref 6.5–8.1)

## 2018-09-14 LAB — TSH: TSH: 6.904 u[IU]/mL — ABNORMAL HIGH (ref 0.350–4.500)

## 2018-09-14 LAB — LIPID PANEL
CHOL/HDL RATIO: 3.4 ratio
CHOLESTEROL: 135 mg/dL (ref 0–200)
HDL: 40 mg/dL — ABNORMAL LOW (ref >40)
LDL Cholesterol: 82 mg/dL (ref 0–99)
TRIGLYCERIDES: 66 mg/dL (ref <150)
VLDL: 13 mg/dL (ref 0–40)

## 2018-09-14 LAB — T4, FREE: Free T4: 0.71 ng/dL — ABNORMAL LOW (ref 0.82–1.77)

## 2018-09-14 NOTE — Progress Notes (Signed)
Patient ID: Kristine Walton, female   DOB: 07/02/1996, 22 y.o.   MRN: [id number]   Subjective:    Patient ID: Kristine Walton, female    DOB: 1996-06-17, 22 y.o.   MRN: [id number]  HPI  Patient here for a scheduled follow up.  She is accompanied by her mother.  History obtained from both of them.  She is living with her mother now.  Feels better.  Doing better.  Has gained some weight.  Discussed diet and exercise.  She has started adjusting her diet.  Also starting to go to the gym.  Headaches are better.  No chest pain.  Breathing stable.  Has noticed occasional dizziness with quick turns or movements.  Only notices at this time.  No headache.  No persistent dizziness.  Has f/u appt with cardiology this week.     Past Medical History:  Diagnosis Date  . Diplopia   . Murmur   . Premature birth   . Thyroid disease    Past Surgical History:  Procedure Laterality Date  . TETRALOGY OF FALLOT REPAIR  1998   Family History  Problem Relation Age of Onset  . Healthy Mother   . Hypertension Father    Social History   Socioeconomic History  . Marital status: Single    Spouse name: Not on file  . Number of children: 0  . Years of education: 68  . Highest education level: High school graduate  Occupational History  . Occupation: Unemployed  Social Needs  . Financial resource strain: Not on file  . Food insecurity:    Worry: Not on file    Inability: Not on file  . Transportation needs:    Medical: Not on file    Non-medical: Not on file  Tobacco Use  . Smoking status: Never Smoker  . Smokeless tobacco: Never Used  Substance and Sexual Activity  . Alcohol use: No  . Drug use: No  . Sexual activity: Not on file  Lifestyle  . Physical activity:    Days per week: Not on file    Minutes per session: Not on file  . Stress: Not on file  Relationships  . Social connections:    Talks on phone: Not on file    Gets together: Not on file    Attends religious service: Not on file   Active member of club or organization: Not on file    Attends meetings of clubs or organizations: Not on file    Relationship status: Not on file  Other Topics Concern  . Not on file  Social History Narrative   Lives at home with her father and stepmother.   Right-handed.   Occasional caffeine use.    Outpatient Encounter Medications as of 09/14/2018  Medication Sig  . acyclovir (ZOVIRAX) 400 MG tablet Take one tablet tid x 5 days - with flare.  . cetirizine (ZYRTEC) 10 MG tablet Take 1 tablet by mouth as needed.   . [DISCONTINUED] levothyroxine (SYNTHROID, LEVOTHROID) 50 MCG tablet Take 1 tablet (50 mcg total) by mouth daily.   No facility-administered encounter medications on file as of 09/14/2018.     Review of Systems  Constitutional: Negative for appetite change and unexpected weight change.  HENT: Negative for congestion and sinus pressure.   Respiratory: Negative for cough, chest tightness and shortness of breath.   Cardiovascular: Negative for chest pain, palpitations and leg swelling.  Gastrointestinal: Negative for abdominal pain, diarrhea, nausea and vomiting.  Genitourinary: Negative for  difficulty urinating and dysuria.  Musculoskeletal: Negative for joint swelling and myalgias.  Skin: Negative for color change and rash.  Neurological: Positive for dizziness. Negative for headaches.  Psychiatric/Behavioral: Negative for agitation and dysphoric mood.       Objective:    Physical Exam  Constitutional: She appears well-developed and well-nourished. No distress.  HENT:  Nose: Nose normal.  Mouth/Throat: Oropharynx is clear and moist.  Neck: Neck supple. No thyromegaly present.  Cardiovascular: Normal rate and regular rhythm.  Pulmonary/Chest: Breath sounds normal. No respiratory distress. She has no wheezes.  Abdominal: Soft. Bowel sounds are normal. There is no tenderness.  Musculoskeletal: She exhibits no edema or tenderness.  Lymphadenopathy:    She has no  cervical adenopathy.  Skin: No rash noted. No erythema.  Psychiatric: She has a normal mood and affect. Her behavior is normal.    BP 118/78 (BP Location: Left Arm, Patient Position: Sitting, Cuff Size: Normal)   Pulse 76   Temp 98.4 F (36.9 C) (Oral)   Resp 18   Wt 162 lb 12.8 oz (73.8 kg)   SpO2 98%   BMI 34.03 kg/m  Wt Readings from Last 3 Encounters:  09/14/18 162 lb 12.8 oz (73.8 kg)  04/25/18 154 lb 6.4 oz (70 kg)  04/01/18 149 lb 12 oz (67.9 kg)     Lab Results  Component Value Date   WBC 8.3 09/14/2018   HGB 12.8 09/14/2018   HCT 37.7 09/14/2018   PLT 211 09/14/2018   GLUCOSE 103 (H) 09/14/2018   CHOL 135 09/14/2018   TRIG 66 09/14/2018   HDL 40 (L) 09/14/2018   LDLCALC 82 09/14/2018   ALT 23 09/14/2018   AST 21 09/14/2018   NA 142 09/14/2018   K 4.3 09/14/2018   CL 109 09/14/2018   CREATININE 0.67 09/14/2018   BUN 14 09/14/2018   CO2 23 09/14/2018   TSH 6.904 (H) 09/14/2018       Assessment & Plan:   Problem List Items Addressed This Visit    Diplopia    Improved with prisms.  Seeing ophthalmology.        Dizziness    Only occurs with quick movements.  Discussed staying hydrated, etc.  Keep f/u with cardiology.        Hypothyroidism - Primary    On synthroid.  Follow tsh.  Recheck today.       Relevant Orders   CBC with Differential/Platelet (Completed)   Comprehensive metabolic panel (Completed)   TSH (Completed)   T4, free (Completed)   Tetralogy of Fallot    Feels things are stable.  Has f/u scheduled with cardiology this week.         Other Visit Diagnoses    Screening cholesterol level       Relevant Orders   Lipid panel (Completed)       Dale [place], MD

## 2018-09-15 ENCOUNTER — Other Ambulatory Visit: Payer: Self-pay | Admitting: Internal Medicine

## 2018-09-15 ENCOUNTER — Other Ambulatory Visit: Payer: Self-pay

## 2018-09-15 DIAGNOSIS — E039 Hypothyroidism, unspecified: Secondary | ICD-10-CM

## 2018-09-15 DIAGNOSIS — R778 Other specified abnormalities of plasma proteins: Secondary | ICD-10-CM

## 2018-09-15 MED ORDER — LEVOTHYROXINE SODIUM 75 MCG PO TABS: 75.0000 ug | ORAL_TABLET | Freq: Every day | ORAL | 3 refills | Status: DC

## 2018-09-15 NOTE — Progress Notes (Signed)
Orders placed for f/u labs.  

## 2018-09-15 NOTE — Progress Notes (Signed)
Order placed for f/u tsh.  

## 2018-09-17 ENCOUNTER — Encounter: Payer: Self-pay | Admitting: Internal Medicine

## 2018-09-17 DIAGNOSIS — R42 Dizziness and giddiness: Secondary | ICD-10-CM | POA: Insufficient documentation

## 2018-09-17 NOTE — Assessment & Plan Note (Signed)
Improved with prisms.  Seeing ophthalmology.   

## 2018-09-17 NOTE — Assessment & Plan Note (Signed)
Feels things are stable.  Has f/u scheduled with cardiology this week.

## 2018-09-17 NOTE — Assessment & Plan Note (Signed)
On synthroid.  Follow tsh.  Recheck today.    

## 2018-09-17 NOTE — Assessment & Plan Note (Signed)
Only occurs with quick movements.  Discussed staying hydrated, etc.  Keep f/u with cardiology.

## 2018-09-21 ENCOUNTER — Other Ambulatory Visit: Payer: Self-pay | Admitting: Internal Medicine

## 2018-09-27 IMAGING — MR MR HEAD WO/W CM
12 series · 48 of 48 positions shown · IV contrast (10mL MULTIHANCE)
Comparison: Head CT 11/13/2005

CLINICAL DATA: Diplopia.  Remote history of concussion.

EXAM:
MRI HEAD WITHOUT AND WITH CONTRAST
TECHNIQUE: Multiplanar, multiecho pulse sequences of the brain and surrounding
structures were obtained without and with intravenous contrast.
CONTRAST:  10mL MULTIHANCE GADOBENATE DIMEGLUMINE 529 MG/ML IV SOLN

[Series 2: T1 · sagittal · 5.0mm · 0.45mm/px · 1 of 25 slices shown (1 of 2)]
[im 1/25]
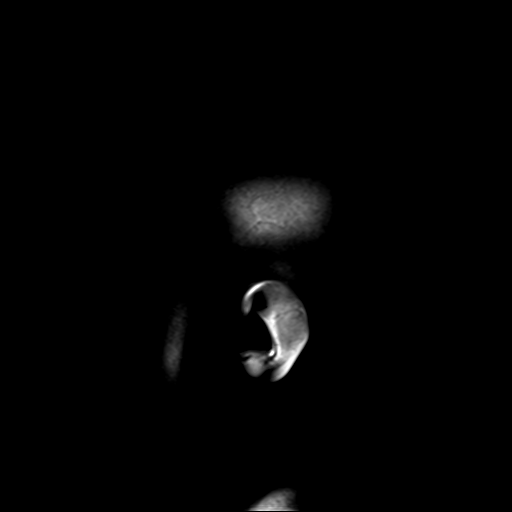

[Series 4: DWI · axial · 3.0mm · 1.80mm/px · z∈[-74,+87]mm · 3 of 55 slices shown (1 of 2)]
[im 1/55]
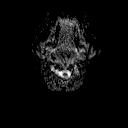
[im 28/55]
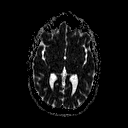
[im 55/55]
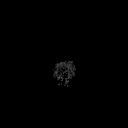

[Series 6: DWI · coronal · 3.0mm · 1.80mm/px · 3 of 45 slices shown (2 of 2)]
[im 1/45]
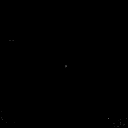
[im 23/45]
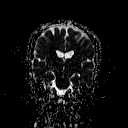
[im 45/45]
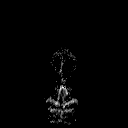

[Series 7: T2 · axial · 5.0mm · 0.60mm/px · z∈[-66,+89]mm · 2 of 25 slices shown (1 of 2)]
[im 1/25]
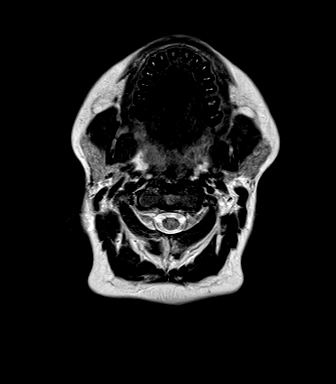
[im 25/25]
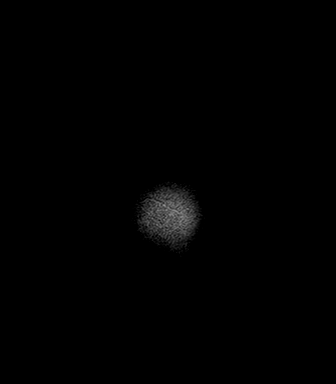

[Series 8: FLAIR · axial · 3.0mm · 0.45mm/px · z∈[-66,+89]mm · 4 of 53 slices shown]
[im 1/53]
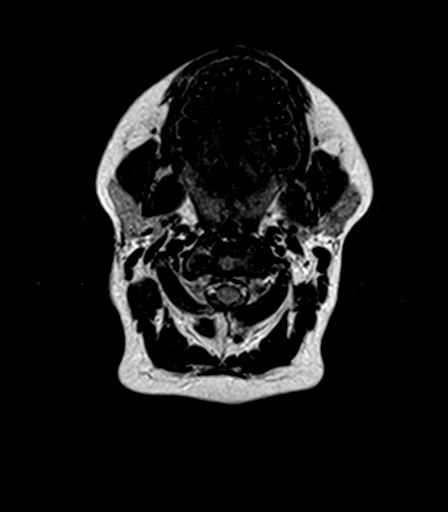
[im 18/53]
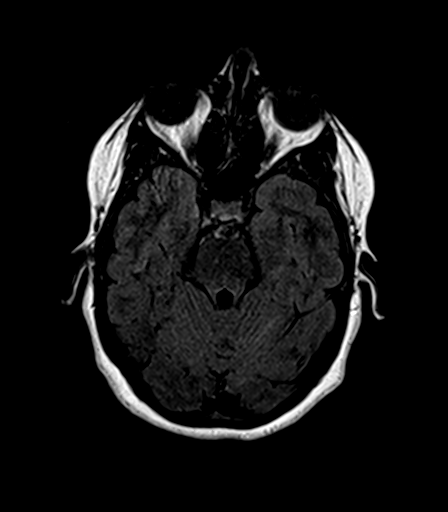
[im 35/53]
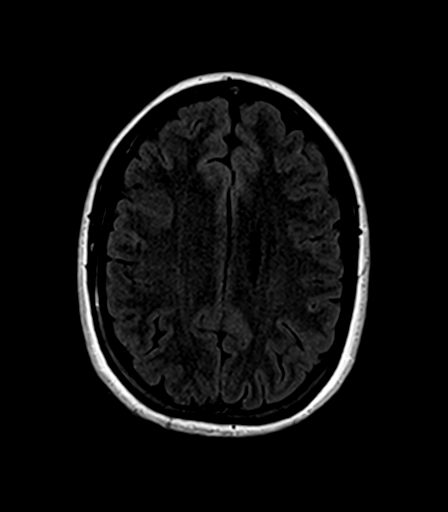
[im 53/53]
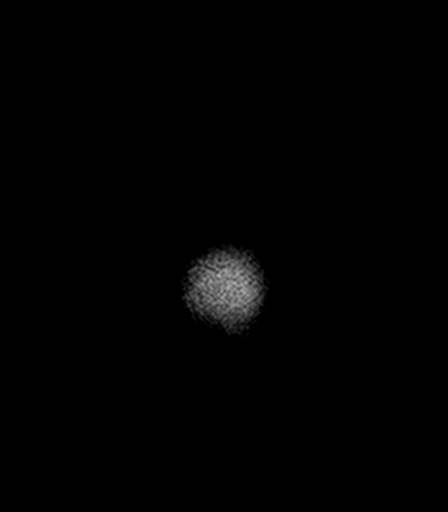

[Series 9: T2 · axial · 5.0mm · 0.45mm/px · z∈[-66,+89]mm · 2 of 25 slices shown (2 of 2)]
[im 1/25]
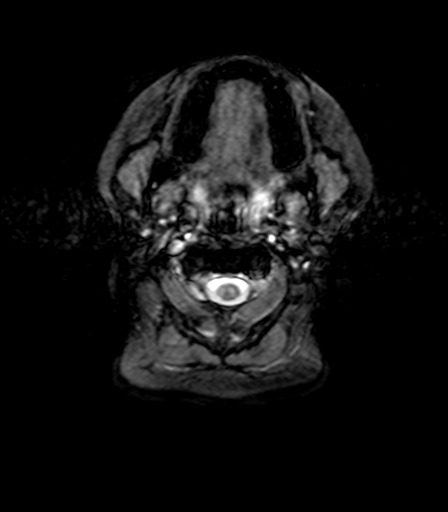
[im 25/25]
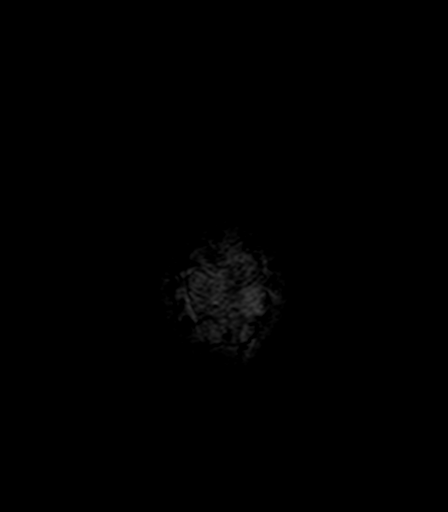

[Series 10: T1 · axial · 1.0mm · 1.00mm/px · z∈[-66,+91]mm · 11 of 160 slices shown (2 of 2)]
[im 1/160]
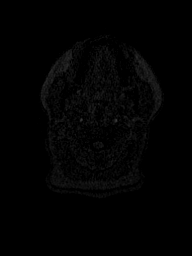
[im 16/160]
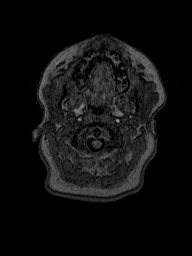
[im 32/160]
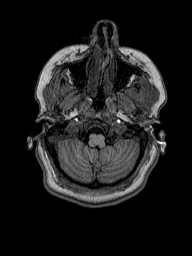
[im 48/160]
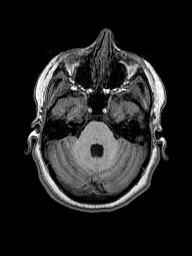
[im 64/160]
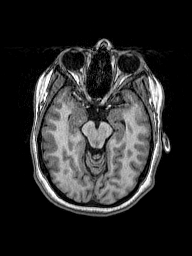
[im 80/160]
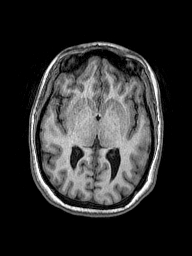
[im 96/160]
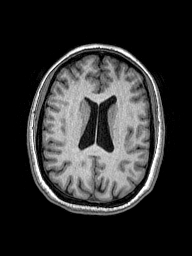
[im 112/160]
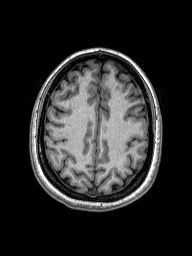
[im 128/160]
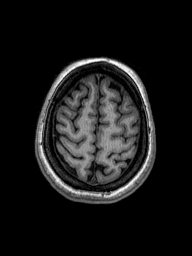
[im 144/160]
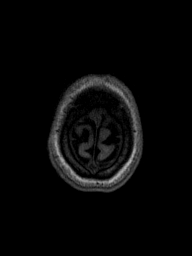
[im 160/160]
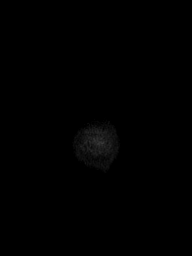

[Series 11: T2 post-contrast · coronal · 5.0mm · 0.49mm/px · 2 of 27 slices shown]
[im 1/27]
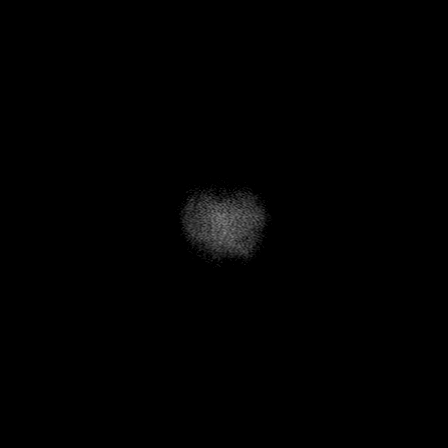
[im 27/27]
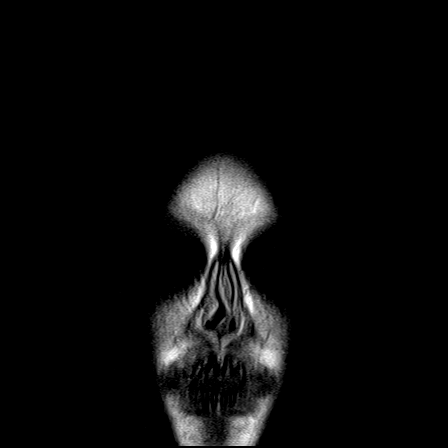

[Series 12: T1 post-contrast · axial · 1.0mm · 1.00mm/px · z∈[-66,+91]mm · 11 of 160 slices shown (1 of 2)]
[im 1/160]
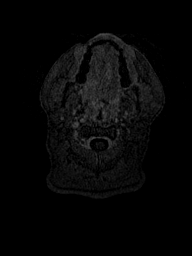
[im 16/160]
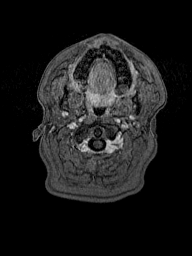
[im 32/160]
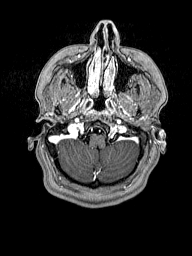
[im 48/160]
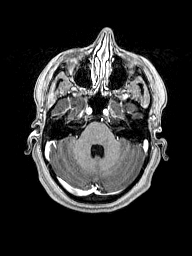
[im 64/160]
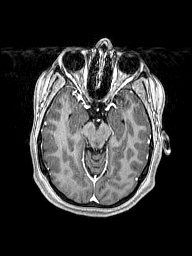
[im 80/160]
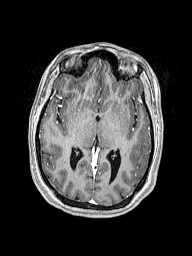
[im 96/160]
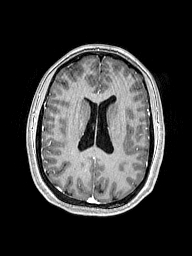
[im 112/160]
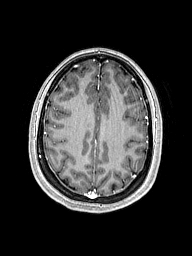
[im 128/160]
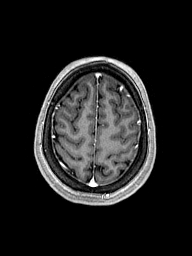
[im 144/160]
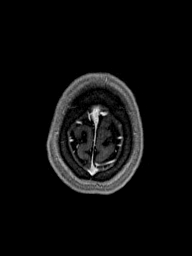
[im 160/160]
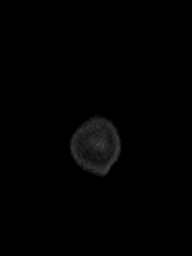

[Series 13: T1 post-contrast · coronal · 5.0mm · 0.43mm/px · 2 of 27 slices shown (2 of 2)]
[im 1/27]
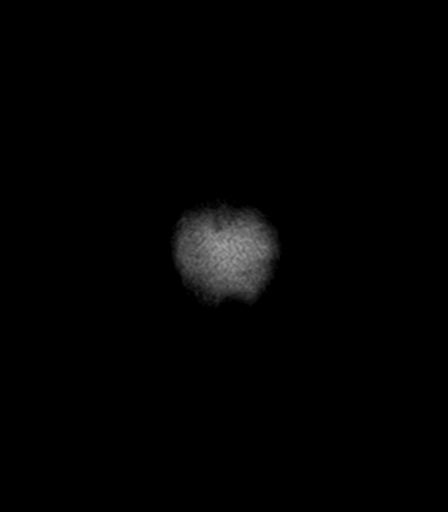
[im 27/27]
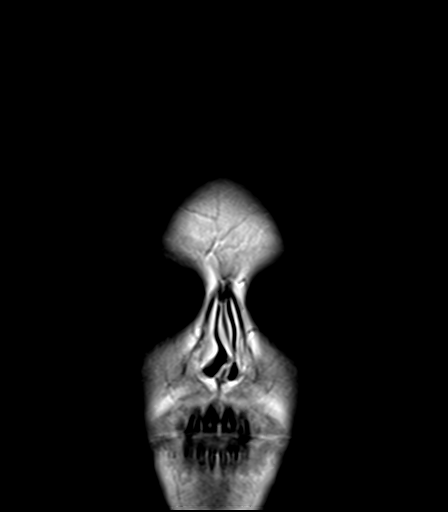

[Series 100: ax (id) · axial · 3.0mm · 1.80mm/px · z∈[-74,+87]mm · 4 of 55 slices shown]
[im 1/55]
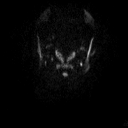
[im 19/55]
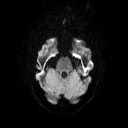
[im 37/55]
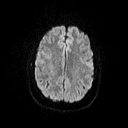
[im 55/55]
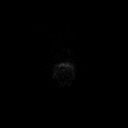

[Series 101: cor (id) · coronal · 3.0mm · 1.80mm/px · 3 of 45 slices shown]
[im 1/45]
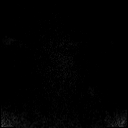
[im 23/45]
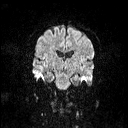
[im 45/45]
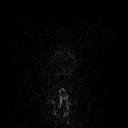

[48 of 48 positions shown; findings below may reference images not displayed]

FINDINGS: BRAIN: The midline structures are normal. There is no acute infarct
or acute hemorrhage. No mass lesion, hydrocephalus, dural
abnormality or extra-axial collection. The brain parenchymal signal
is normal for the patient's age. No age-advanced or lobar
predominant atrophy. No chronic microhemorrhage or superficial
siderosis. No contrast-enhancing abnormality.

VASCULAR: Major intracranial arterial and venous sinus flow voids
are preserved.

SKULL AND UPPER CERVICAL SPINE: The visualized skull base,
calvarium, upper cervical spine and extracranial soft tissues are
normal.

SINUSES/ORBITS: No fluid levels or advanced mucosal thickening. No
mastoid or middle ear effusion. Normal orbits.
IMPRESSION: Normal brain MRI.

## 2019-01-23 ENCOUNTER — Telehealth: Payer: Self-pay

## 2019-01-23 NOTE — Telephone Encounter (Signed)
Copied from CRM [id number]. Topic: Appointment Scheduling - Scheduling Inquiry for Clinic >> Jan 23, 2019  4:16 PM Laural Benes, [place] C wrote: Reason for CRM: pt was scheduled a 4  month follow up with PCP tomorrow. Pt says that she is unable to get off work and would like to know if provider is able to work her in on early on a Monday morning? Pt says that she is aware that provider will be out of the office some but feels that she should have follow up sooner then next available.  (May)  Please assist  CB: [phone]

## 2019-01-24 ENCOUNTER — Ambulatory Visit: Payer: BLUE CROSS/BLUE SHIELD | Admitting: Internal Medicine

## 2019-01-25 NOTE — Telephone Encounter (Signed)
Attempted to reach pt. Unable to leave message due to voicemail not being set up.

## 2019-01-31 NOTE — Telephone Encounter (Signed)
Pt scheduled  

## 2019-03-10 ENCOUNTER — Encounter: Payer: Self-pay | Admitting: *Deleted

## 2019-03-13 ENCOUNTER — Ambulatory Visit (INDEPENDENT_AMBULATORY_CARE_PROVIDER_SITE_OTHER): Payer: BLUE CROSS/BLUE SHIELD | Admitting: Internal Medicine

## 2019-03-13 ENCOUNTER — Encounter: Payer: Self-pay | Admitting: Internal Medicine

## 2019-03-13 DIAGNOSIS — E039 Hypothyroidism, unspecified: Secondary | ICD-10-CM

## 2019-03-13 DIAGNOSIS — Q213 Tetralogy of Fallot: Secondary | ICD-10-CM

## 2019-03-13 DIAGNOSIS — H532 Diplopia: Secondary | ICD-10-CM

## 2019-03-13 NOTE — Assessment & Plan Note (Signed)
On synthroid.  Dose adjusted last visit.  Schedule f/u tsh.

## 2019-03-13 NOTE — Progress Notes (Signed)
Patient ID: Kristine Walton, female   DOB: September 04, 1996, 23 y.o.   MRN: [id number] Virtual Visit via Video Note  I connected with Kristine Walton and her mother on 03/13/19 at  9:30 AM EDT by a video enabled telemedicine application and verified that I am speaking with the correct person using two identifiers.  Location patient: home Location provider:work.  Persons participating in the virtual visit: patient, provider and pts mother.   I discussed the limitations of evaluation and management by telemedicine.  This visit type was conducted due to national recommendations for restrictions regarding the COVID-19 pandemic.  This format is felt to be most appropriate for this patient at this time.  The patient expressed understanding and agreed to proceed.   HPI: Scheduled for a follow up appt.  She was last seen 09/14/18.  She had discussed joining a gym and exercising.  Also diet adjustment.  She has joined a gym.  Not able to go now with COVID restrictions.  Is walking.  No chest pain.  No sob.  No acid reflux.  No abdominal pain.  Bowels moving.  Headaches are better.  Diplopia improved with prisms.  Sees ophthalmology.  Plans to call for f/u.  On synthroid q day now.  Due f/u tsh.  Will schedule in future.     ROS: See pertinent positives and negatives per HPI.   Past Medical History:  Diagnosis Date  . Diplopia   . Murmur   . Premature birth   . Thyroid disease     Past Surgical History:  Procedure Laterality Date  . TETRALOGY OF FALLOT REPAIR  1998    Family History  Problem Relation Age of Onset  . Healthy Mother   . Hypertension Father     SOCIAL HX: reviewed.    Current Outpatient Medications:  .  acyclovir (ZOVIRAX) 400 MG tablet, Take one tablet tid x 5 days - with flare., Disp: 30 tablet, Rfl: 0 .  cetirizine (ZYRTEC) 10 MG tablet, Take 1 tablet by mouth as needed. , Disp: , Rfl:  .  levothyroxine (SYNTHROID, LEVOTHROID) 75 MCG tablet, Take 1 tablet (75 mcg total) by  mouth daily., Disp: 90 tablet, Rfl: 3  EXAM:  GENERAL: alert, oriented, appears well and in no acute distress  HEENT: atraumatic, conjunttiva clear, no obvious abnormalities on inspection of external nose.   NECK: normal movements of the head and neck  LUNGS: on inspection no signs of respiratory distress, breathing rate appears normal, no obvious gross SOB, gasping or wheezing  CV: no obvious cyanosis  PSYCH/NEURO: pleasant and cooperative, no obvious depression or anxiety, speech and thought processing grossly intact  ASSESSMENT AND PLAN:  Discussed the following assessment and plan:  Diplopia  Hypothyroidism, unspecified type  Tetralogy of Fallot     I discussed the assessment and treatment plan with the patient. The patient was provided an opportunity to ask questions and all were answered. The patient agreed with the plan and demonstrated an understanding of the instructions.   The patient was advised to call back or seek an in-person evaluation if the symptoms worsen or if the condition fails to improve as anticipated.   Dale [place], MD

## 2019-03-13 NOTE — Assessment & Plan Note (Signed)
Improved with prisms.  Seeing ophthalmology.

## 2019-03-13 NOTE — Assessment & Plan Note (Signed)
Followed by cardiology 

## 2019-03-21 ENCOUNTER — Other Ambulatory Visit: Payer: Self-pay | Admitting: Internal Medicine

## 2019-03-21 DIAGNOSIS — E039 Hypothyroidism, unspecified: Secondary | ICD-10-CM

## 2019-03-21 DIAGNOSIS — R778 Other specified abnormalities of plasma proteins: Secondary | ICD-10-CM

## 2019-03-21 NOTE — Progress Notes (Signed)
Orders placed for labs to be drawn at hospital.

## 2019-03-22 ENCOUNTER — Telehealth: Payer: Self-pay

## 2019-03-22 NOTE — Telephone Encounter (Signed)
-----   Message from Dale Ona, MD sent at 03/21/2019 12:41 PM EDT ----- Regarding: FW: appts scheduled I have ordered the labs to be drawn at [facility].  Let her know that these will be non fasting labs.  Please let her know to go by in 2 months.    Dr Lorin Picket ----- Message ----- From: Gari Crown Sent: 03/21/2019  10:26 AM EDT To: Dale Parsons, MD Subject: RE: appts scheduled                            Patient wants to have labs drawn at the hospital .Veins small difficulty drawing in this office.  ----- Message ----- From: Dale , MD Sent: 03/13/2019  10:50 PM EDT To: Gari Crown Subject: appts scheduled                                Needs physical scheduled in 3 months.  Also needs fasting labs in 2 months.  Thanks.    Dr Lorin Picket

## 2019-03-22 NOTE — Telephone Encounter (Signed)
Patient's mom aware

## 2019-05-22 ENCOUNTER — Other Ambulatory Visit (INDEPENDENT_AMBULATORY_CARE_PROVIDER_SITE_OTHER): Payer: BC Managed Care – PPO

## 2019-05-22 ENCOUNTER — Other Ambulatory Visit: Payer: Self-pay

## 2019-05-22 ENCOUNTER — Other Ambulatory Visit: Payer: Self-pay | Admitting: Internal Medicine

## 2019-05-22 DIAGNOSIS — E039 Hypothyroidism, unspecified: Secondary | ICD-10-CM

## 2019-05-22 DIAGNOSIS — R778 Other specified abnormalities of plasma proteins: Secondary | ICD-10-CM

## 2019-05-22 LAB — HEPATIC FUNCTION PANEL
ALT: 14 U/L (ref 0–35)
AST: 12 U/L (ref 0–37)
Albumin: 4.1 g/dL (ref 3.5–5.2)
Alkaline Phosphatase: 72 U/L (ref 39–117)
Bilirubin, Direct: 0.1 mg/dL (ref 0.0–0.3)
Total Bilirubin: 0.3 mg/dL (ref 0.2–1.2)
Total Protein: 6.8 g/dL (ref 6.0–8.3)

## 2019-05-22 LAB — T4, FREE: Free T4: 0.8 ng/dL (ref 0.60–1.60)

## 2019-05-22 LAB — TSH: TSH: 4.6 u[IU]/mL — ABNORMAL HIGH (ref 0.35–4.50)

## 2019-05-22 NOTE — Progress Notes (Signed)
Order placed for f/u labs.  

## 2019-05-22 NOTE — Addendum Note (Signed)
Addended by: Leeanne Rio on: 05/22/2019 08:15 AM   Modules accepted: Orders

## 2019-05-23 ENCOUNTER — Other Ambulatory Visit: Payer: Self-pay

## 2019-05-23 MED ORDER — LEVOTHYROXINE SODIUM 88 MCG PO TABS: 88.0000 ug | ORAL_TABLET | Freq: Every day | ORAL | 3 refills | Status: DC

## 2019-06-20 ENCOUNTER — Encounter: Payer: Self-pay | Admitting: Internal Medicine

## 2019-06-20 ENCOUNTER — Other Ambulatory Visit: Payer: Self-pay

## 2019-06-20 ENCOUNTER — Ambulatory Visit (INDEPENDENT_AMBULATORY_CARE_PROVIDER_SITE_OTHER): Payer: Self-pay | Admitting: Internal Medicine

## 2019-06-20 DIAGNOSIS — E039 Hypothyroidism, unspecified: Secondary | ICD-10-CM

## 2019-06-20 DIAGNOSIS — R519 Headache, unspecified: Secondary | ICD-10-CM

## 2019-06-20 DIAGNOSIS — H532 Diplopia: Secondary | ICD-10-CM

## 2019-06-20 DIAGNOSIS — R635 Abnormal weight gain: Secondary | ICD-10-CM

## 2019-06-20 DIAGNOSIS — R51 Headache: Secondary | ICD-10-CM

## 2019-06-20 DIAGNOSIS — Q213 Tetralogy of Fallot: Secondary | ICD-10-CM

## 2019-06-20 NOTE — Progress Notes (Signed)
Patient ID: Kristine Walton, female   DOB: 24-May-1996, 23 y.o.   MRN: [id number]   Virtual Visit via video Note  This visit type was conducted due to national recommendations for restrictions regarding the COVID-19 pandemic (e.g. social distancing).  This format is felt to be most appropriate for this patient at this time.  All issues noted in this document were discussed and addressed.  No physical exam was performed (except for noted visual exam findings with Video Visits).   I connected with Kristine Walton by a video enabled telemedicine application or telephone and verified that I am speaking with the correct person using two identifiers. Location patient: home Location provider: work Persons participating in the virtual visit: patient, provider and pts mother.   I discussed the limitations, risks, security and privacy concerns of performing an evaluation and management service by video and the availability of in person appointments. The patient expressed understanding and agreed to proceed.  Interactive audio and video telecommunications were attempted between this provider and patient and was successful for most of the visit.  Due to incresed static, we had to convert to a telephone visit.  We continued and completed visit with audio only.   Reason for visit: scheduled follow up.   HPI: She reports she is doing relatively well.  Staying in due to covid restrictions.  Mother was concerned regarding weight gain.  Discussed diet and exercise.  She has recently started adjusting her diet.  Not snacking as much.  Her spin class just restarted - outside classes.  She is walking.  No chest pain.  No sob.  No acid reflux.  No abdominal pain.  Bowels moving.  Has not started her new dose of tsh.  Plans to start.  Still taking the previous dose.  Headaches are better.  LMP 05/28/19.  Handling stress.     ROS: See pertinent positives and negatives per HPI.  Past Medical History:  Diagnosis Date  .  Diplopia   . Murmur   . Premature birth   . Thyroid disease     Past Surgical History:  Procedure Laterality Date  . TETRALOGY OF FALLOT REPAIR  1998    Family History  Problem Relation Age of Onset  . Healthy Mother   . Hypertension Father     SOCIAL HX: reviewed.    Current Outpatient Medications:  .  acyclovir (ZOVIRAX) 400 MG tablet, Take one tablet tid x 5 days - with flare., Disp: 30 tablet, Rfl: 0 .  cetirizine (ZYRTEC) 10 MG tablet, Take 1 tablet by mouth as needed. , Disp: , Rfl:  .  levothyroxine (SYNTHROID) 88 MCG tablet, Take 1 tablet (88 mcg total) by mouth daily., Disp: 90 tablet, Rfl: 3  EXAM:  GENERAL: alert, oriented, appears well and in no acute distress  HEENT: atraumatic, conjunttiva clear, no obvious abnormalities on inspection of external nose and ears  NECK: normal movements of the head and neck  LUNGS: on inspection no signs of respiratory distress, breathing rate appears normal, no obvious gross SOB, gasping or wheezing  CV: no obvious cyanosis  PSYCH/NEURO: pleasant and cooperative, no obvious depression or anxiety, speech and thought processing grossly intact  ASSESSMENT AND PLAN:  Discussed the following assessment and plan:  Diplopia Improved with prisms.    Headache Better.  Not a significant issue now.    Hypothyroidism On thyroid replacemtn. Plans to start 37mcg.  Schedule for f/u tsh.    Tetralogy of Fallot Followed by cardiology.  Weight gain Discussed concerns with her mother. Discussed diet and exercise.  Follow.      I discussed the assessment and treatment plan with the patient. The patient was provided an opportunity to ask questions and all were answered. The patient agreed with the plan and demonstrated an understanding of the instructions.   The patient was advised to call back or seek an in-person evaluation if the symptoms worsen or if the condition fails to improve as anticipated.  I provided 20 minutes of  non-face-to-face time during this encounter.   Dale Durhamharlene Olyver [place], MD

## 2019-06-25 ENCOUNTER — Encounter: Payer: Self-pay | Admitting: Internal Medicine

## 2019-06-25 DIAGNOSIS — R519 Headache, unspecified: Secondary | ICD-10-CM | POA: Insufficient documentation

## 2019-06-25 DIAGNOSIS — R635 Abnormal weight gain: Secondary | ICD-10-CM | POA: Insufficient documentation

## 2019-06-25 NOTE — Assessment & Plan Note (Signed)
Better.  Not a significant issue now.

## 2019-06-25 NOTE — Assessment & Plan Note (Signed)
On thyroid replacemtn. Plans to start 28mcg.  Schedule for f/u tsh.

## 2019-06-25 NOTE — Assessment & Plan Note (Signed)
Followed by cardiology 

## 2019-06-25 NOTE — Assessment & Plan Note (Signed)
Improved with prisms.

## 2019-06-25 NOTE — Assessment & Plan Note (Signed)
Discussed concerns with her mother. Discussed diet and exercise.  Follow.

## 2019-07-04 ENCOUNTER — Other Ambulatory Visit: Payer: BC Managed Care – PPO

## 2019-08-23 ENCOUNTER — Other Ambulatory Visit: Payer: Self-pay

## 2019-09-01 ENCOUNTER — Ambulatory Visit: Payer: Self-pay | Admitting: Family Medicine

## 2019-09-04 ENCOUNTER — Other Ambulatory Visit: Payer: Self-pay

## 2019-09-04 ENCOUNTER — Other Ambulatory Visit (INDEPENDENT_AMBULATORY_CARE_PROVIDER_SITE_OTHER): Payer: Self-pay

## 2019-09-04 ENCOUNTER — Ambulatory Visit: Payer: Self-pay | Admitting: Family Medicine

## 2019-09-04 DIAGNOSIS — E039 Hypothyroidism, unspecified: Secondary | ICD-10-CM

## 2019-09-04 LAB — TSH: TSH: 4.88 u[IU]/mL — ABNORMAL HIGH (ref 0.35–4.50)

## 2019-09-04 LAB — T4, FREE: Free T4: 0.76 ng/dL (ref 0.60–1.60)

## 2019-09-13 ENCOUNTER — Other Ambulatory Visit: Payer: Self-pay

## 2019-09-13 MED ORDER — LEVOTHYROXINE SODIUM 100 MCG PO TABS: 100.0000 ug | ORAL_TABLET | Freq: Every day | ORAL | 1 refills | Status: AC

## 2019-11-01 ENCOUNTER — Ambulatory Visit: Payer: Self-pay | Admitting: Internal Medicine

## 2021-01-08 ENCOUNTER — Encounter: Payer: Self-pay | Admitting: *Deleted
